# Patient Record
Sex: Male | Born: 1985 | Race: Black or African American | Hispanic: No | Marital: Single | Smoking: Never smoker
Health system: Southern US, Community
[De-identification: ages and names within clinical notes are randomized; demographics above are authoritative.]

---

## 2007-11-12 ENCOUNTER — Emergency Department (HOSPITAL_COMMUNITY): Admission: EM | Admit: 2007-11-12 | Discharge: 2007-11-12 | Payer: Self-pay | Admitting: Emergency Medicine

## 2010-05-16 ENCOUNTER — Emergency Department (HOSPITAL_COMMUNITY)
Admission: EM | Admit: 2010-05-16 | Discharge: 2010-05-17 | Payer: Self-pay | Source: Home / Self Care | Admitting: Emergency Medicine

## 2010-08-19 LAB — CBC
Hemoglobin: 15.3 g/dL (ref 13.0–17.0)
MCHC: 33 g/dL (ref 30.0–36.0)
Platelets: 175 10*3/uL (ref 150–400)
RDW: 14.5 % (ref 11.5–15.5)

## 2010-08-19 LAB — RAPID URINE DRUG SCREEN, HOSP PERFORMED
Cocaine: NOT DETECTED
Opiates: NOT DETECTED

## 2010-08-19 LAB — POCT I-STAT, CHEM 8
Chloride: 106 mEq/L (ref 96–112)
HCT: 50 % (ref 39.0–52.0)
Potassium: 3.9 mEq/L (ref 3.5–5.1)
Sodium: 143 mEq/L (ref 135–145)

## 2010-08-19 LAB — DIFFERENTIAL
Basophils Absolute: 0 10*3/uL (ref 0.0–0.1)
Basophils Relative: 0 % (ref 0–1)
Monocytes Relative: 6 % (ref 3–12)
Neutro Abs: 3.3 10*3/uL (ref 1.7–7.7)
Neutrophils Relative %: 49 % (ref 43–77)

## 2010-08-19 LAB — URINALYSIS, ROUTINE W REFLEX MICROSCOPIC
Nitrite: NEGATIVE
Specific Gravity, Urine: 1.03 (ref 1.005–1.030)
Urobilinogen, UA: 1 mg/dL (ref 0.0–1.0)
pH: 7 (ref 5.0–8.0)

## 2011-11-16 IMAGING — CT CT HEAD W/O CM
1 series · 16 of 30 positions shown, 20 images · non-contrast
Comparison: None.

CLINICAL DATA: Syncope.  Seizure.

CT HEAD WITHOUT CONTRAST
TECHNIQUE: Contiguous axial images were obtained from the base of
the skull through the vertex without contrast.

[Series 2: head trauma 4.8 h37s · axial · 0.45mm/px · z∈[+1171,+1323]mm · 16 of 36 slices shown, 20 images]
[im 2/36  brain]
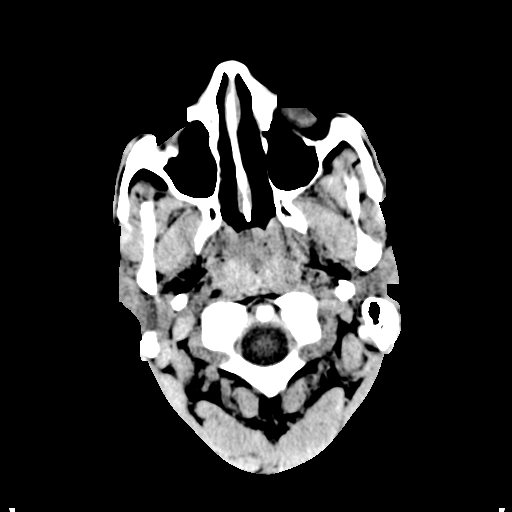
[im 2/36  bone]
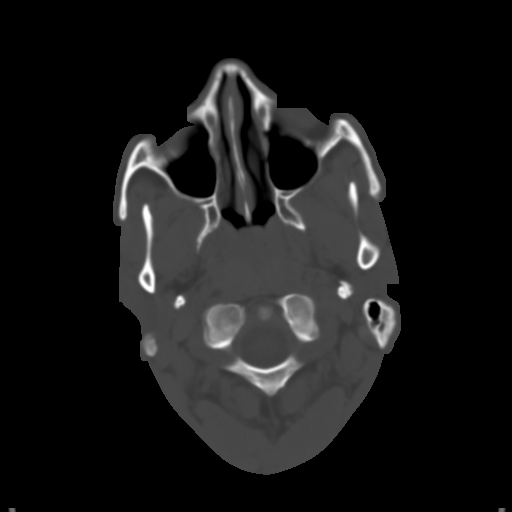
[im 4/36  brain]
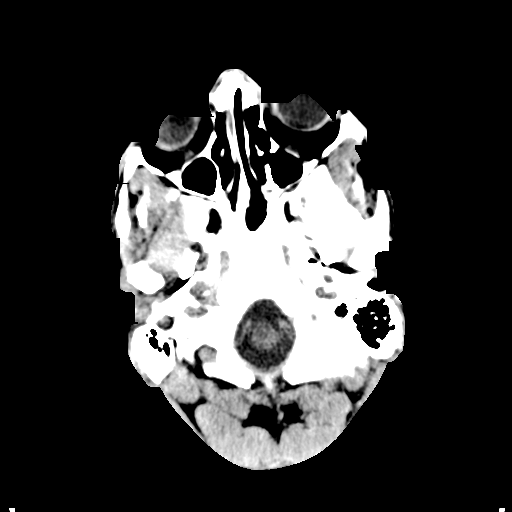
[im 7/36  brain]
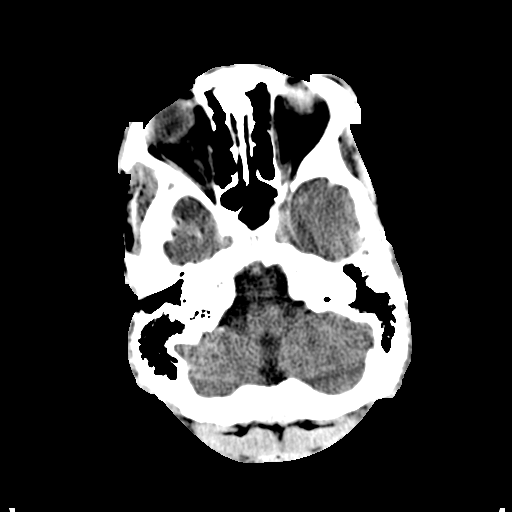
[im 9/36  brain]
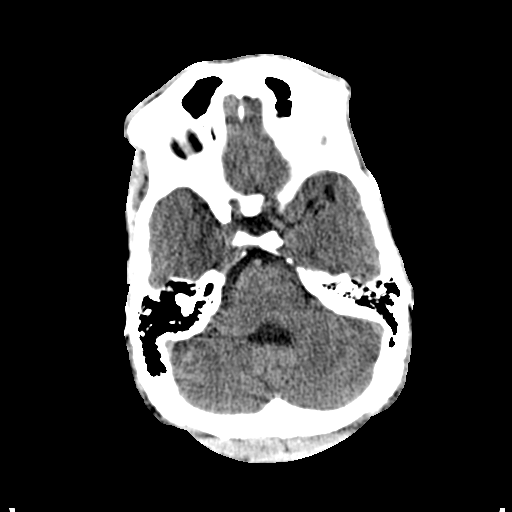
[im 10/36  brain]
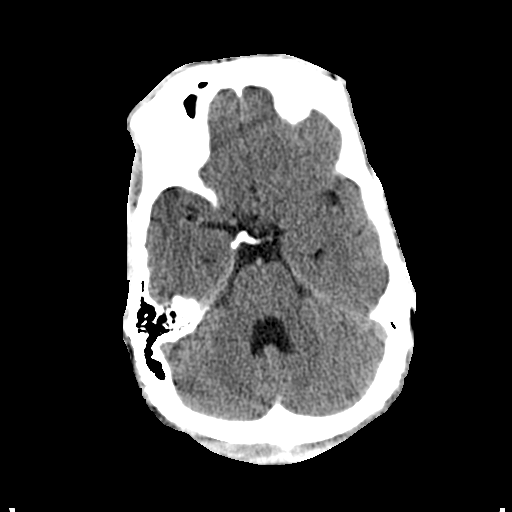
[im 10/36  bone]
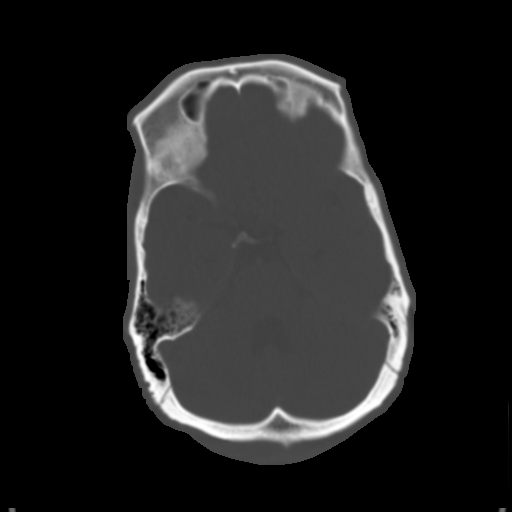
[im 13/36  brain]
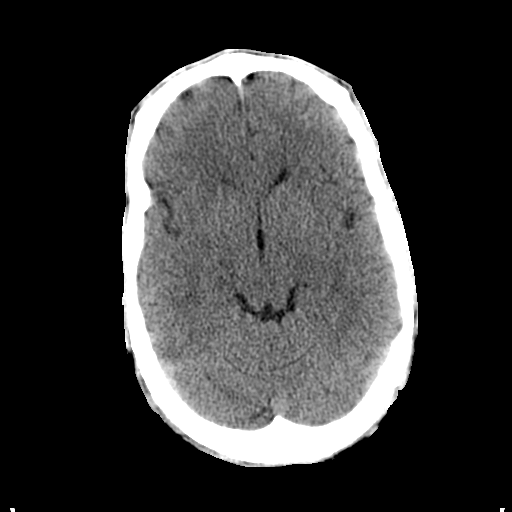
[im 15/36  brain]
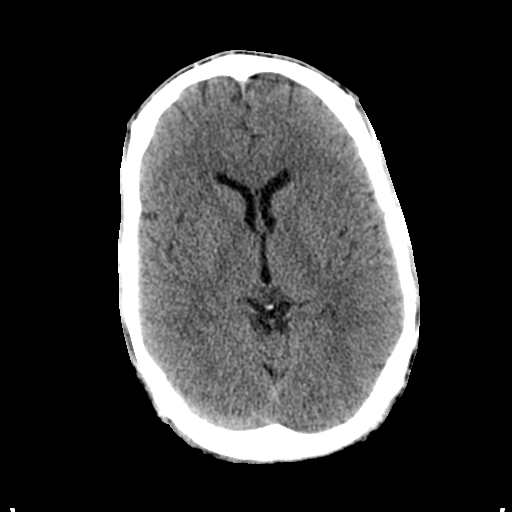
[im 17/36  brain]
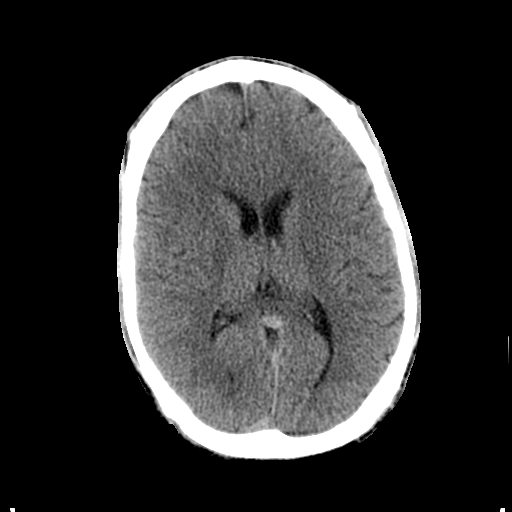
[im 19/36  brain]
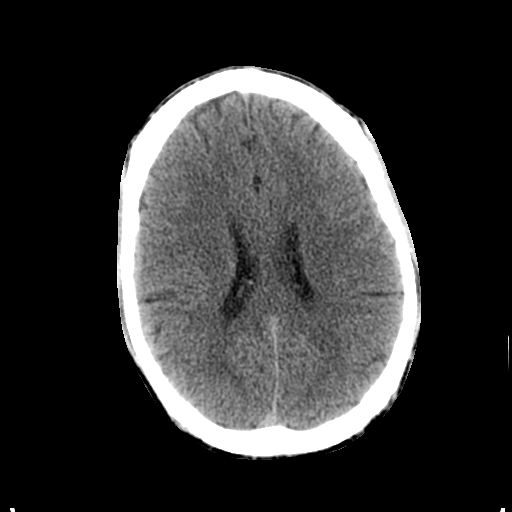
[im 19/36  bone]
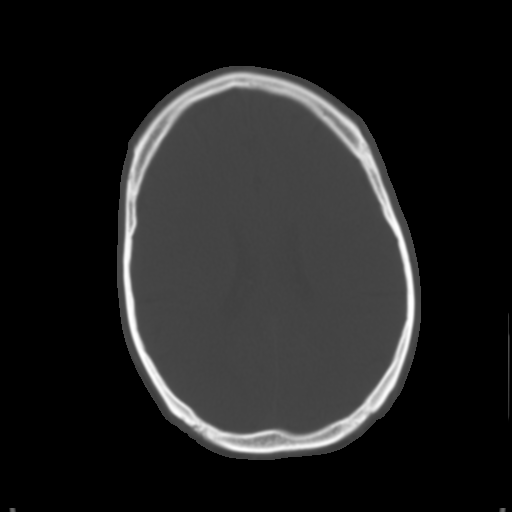
[im 21/36  brain]
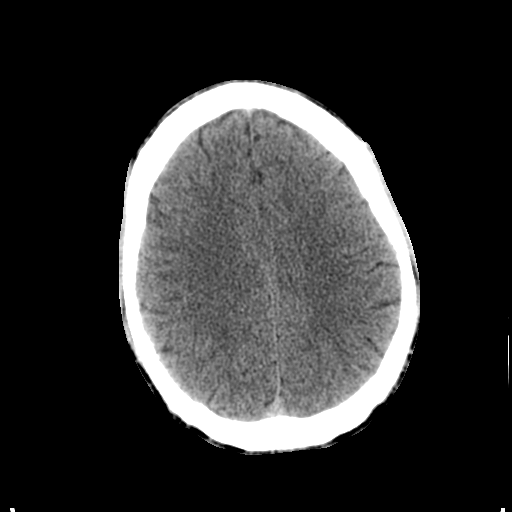
[im 23/36  brain]
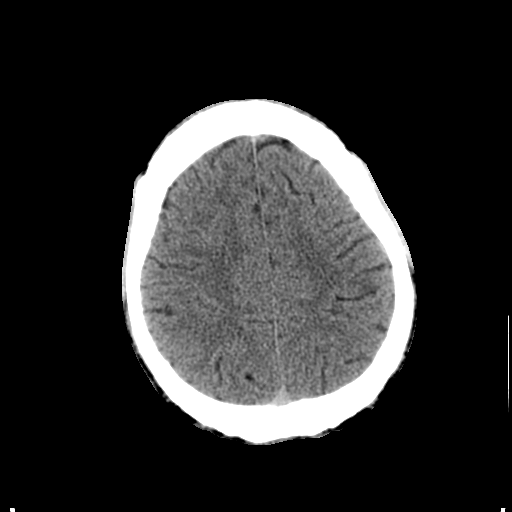
[im 26/36  brain]
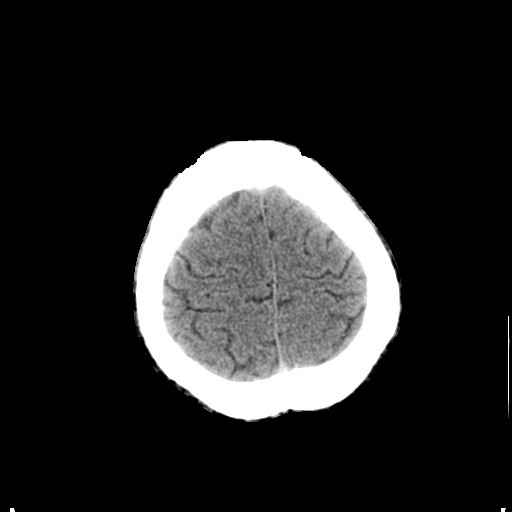
[im 27/36  brain]
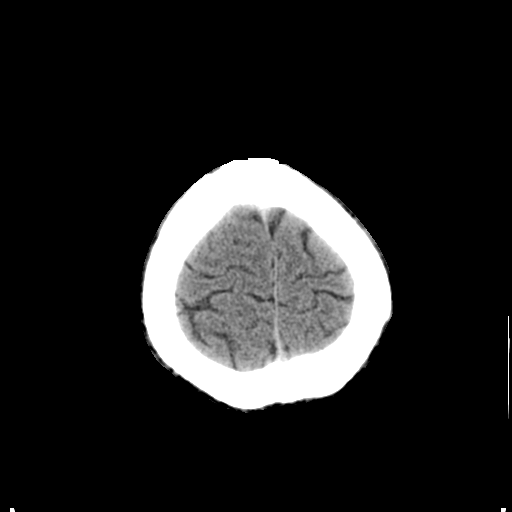
[im 27/36  bone]
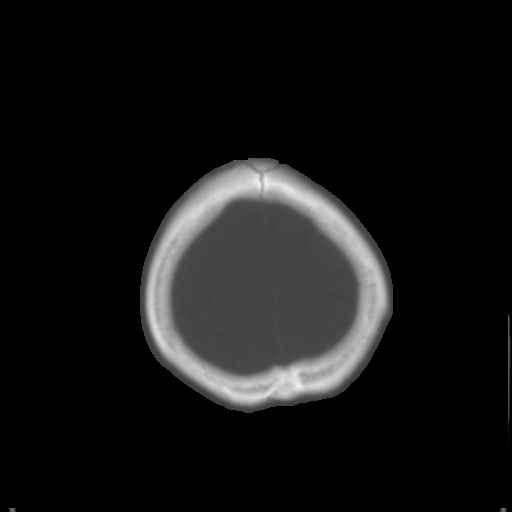
[im 29/36  brain]
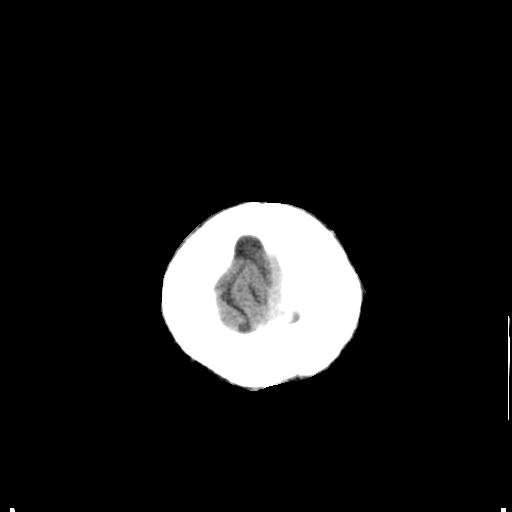
[im 32/36  brain]
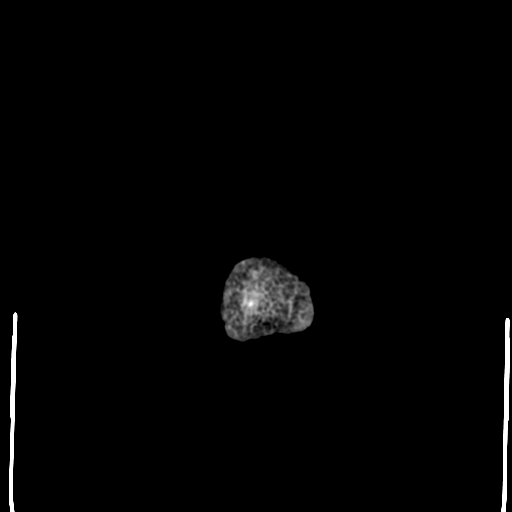
[im 34/36  brain]
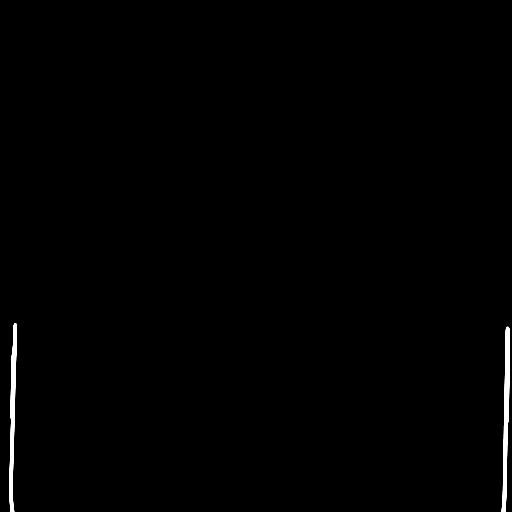

[16 of 30 positions shown; findings below may reference images not displayed]

FINDINGS: There is no evidence for acute hemorrhage, hydrocephalus,
mass lesion, or abnormal extra-axial fluid collection.  No definite
CT evidence for acute infarction.  The visualized paranasal sinuses
and mastoid air cells are clear.
IMPRESSION: Normal exam.

## 2021-07-31 ENCOUNTER — Ambulatory Visit (HOSPITAL_COMMUNITY)
Admission: EM | Admit: 2021-07-31 | Discharge: 2021-07-31 | Disposition: A | Discharge status: Home / Self Care | Payer: Self-pay | Attending: Family Medicine | Admitting: Family Medicine

## 2021-07-31 ENCOUNTER — Other Ambulatory Visit: Payer: Self-pay

## 2021-07-31 ENCOUNTER — Encounter (HOSPITAL_COMMUNITY): Payer: Self-pay | Admitting: Emergency Medicine

## 2021-07-31 DIAGNOSIS — S63501A Unspecified sprain of right wrist, initial encounter: Secondary | ICD-10-CM

## 2021-07-31 NOTE — Discharge Instructions (Addendum)
You were seen today for right wrist pain.  This is likely a sprain/over use injury from work.  We have given you a brace today to wear while at work.  I recommend you ice the wrist, rest if possible, and you may use motrin/ibuprofen 600mg  four times/day with food for pain/swelling.

## 2021-07-31 NOTE — ED Triage Notes (Signed)
Right wrist pain.  No known injury.  Patient felt tightness in right wrist after working.  Patient notices pain with certain movements.  Pain is on ulnar aspect of right wrist, particularly with moving wrist backwards.

## 2021-07-31 NOTE — ED Provider Notes (Signed)
[facility]    CSN: [id number] Arrival date & time: 07/31/21  0805      History   Chief Complaint Chief Complaint  Patient presents with   Wrist Pain    HPI Christopher Gardner is a 36 y.o. male.   Patient is here for right wrist pain.  Started yesterday while at work.  He works in a [employer], picking up boxes.  It starting hurting after he got off of work.  No known specific injury.  Pain at the ulnar aspect of the wrist.  Worse with certain movements. No meds taken.   History reviewed. No pertinent past medical history.  There are no problems to display for this patient.   History reviewed. No pertinent surgical history.     Home Medications    Prior to Admission medications   Not on File    Family History Family History  Problem Relation Age of Onset   Healthy Mother     Social History Social History   Tobacco Use   Smoking status: Never   Smokeless tobacco: Never  Vaping Use   Vaping Use: Never used  Substance Use Topics   Alcohol use: Never   Drug use: Never     Allergies   Patient has no known allergies.   Review of Systems Review of Systems  Constitutional: Negative.   HENT: Negative.    Respiratory: Negative.    Cardiovascular: Negative.   Gastrointestinal: Negative.   Genitourinary: Negative.     Physical Exam Triage Vital Signs ED Triage Vitals  Enc Vitals Group     BP 07/31/21 0833 106/63     Pulse Rate 07/31/21 0833 (!) 58     Resp 07/31/21 0833 18     Temp 07/31/21 0833 98.1 F (36.7 C)     Temp Source 07/31/21 0833 Oral     SpO2 07/31/21 0833 97 %     Weight --      Height --      Head Circumference --      Peak Flow --      Pain Score 07/31/21 0830 5     Pain Loc --      Pain Edu? --      Excl. in GC? --    No data found.  Updated Vital Signs BP 106/63 (BP Location: Right Arm)    Pulse (!) 58    Temp 98.1 F (36.7 C) (Oral)    Resp 18    SpO2 97%   Visual Acuity Right Eye Distance:   Left Eye  Distance:   Bilateral Distance:    Right Eye Near:   Left Eye Near:    Bilateral Near:     Physical Exam Musculoskeletal:     Comments: No swelling or deformity to the right medial wrist;  he notes pain at the medial/ulnar aspect of the wrist;  no TTP per se;  full rom of the wrist, but notes pain with any movement;  normal strength     UC Treatments / Results  Labs (all labs ordered are listed, but only abnormal results are displayed) Labs Reviewed - No data to display  EKG   Radiology No results found.  Procedures Procedures (including critical care time)  Medications Ordered in UC Medications - No data to display  Initial Impression / Assessment and Plan / UC Course  I have reviewed the triage vital signs and the nursing notes.  Pertinent labs & imaging results that were available during  my care of the patient were reviewed by me and considered in my medical decision making (see chart for details).    Patient was seen today for wrist pain;  likely strain/over use;  wrist splint given;  discussed rest, ice, nsaids;  will f/u if not improving.   Final Clinical Impressions(s) / UC Diagnoses   Final diagnoses:  Sprain of right wrist, initial encounter     Discharge Instructions      You were seen today for right wrist pain.  This is likely a sprain/over use injury from work.  We have given you a brace today to wear while at work.  I recommend you ice the wrist, rest if possible, and you may use motrin/ibuprofen 600mg  four times/day with food for pain/swelling.     ED Prescriptions   None    PDMP not reviewed this encounter.   , MD 07/31/21 [id number]
